# Patient Record
Sex: Female | Born: 2000 | Hispanic: Yes | Smoking: Never smoker
Health system: Southern US, Community
[De-identification: ages and names within clinical notes are randomized; demographics above are authoritative.]

## PROBLEM LIST (undated history)

## (undated) DIAGNOSIS — N390 Urinary tract infection, site not specified: Secondary | ICD-10-CM

## (undated) DIAGNOSIS — Z789 Other specified health status: Secondary | ICD-10-CM

## (undated) HISTORY — PX: NO PAST SURGERIES: SHX2092

## (undated) HISTORY — DX: Other specified health status: Z78.9

---
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
## 2020-01-09 ENCOUNTER — Encounter: Payer: Medicaid Other | Admitting: Obstetrics & Gynecology

## 2020-01-22 DIAGNOSIS — H5213 Myopia, bilateral: Secondary | ICD-10-CM | POA: Diagnosis not present

## 2020-02-28 DIAGNOSIS — H5213 Myopia, bilateral: Secondary | ICD-10-CM | POA: Diagnosis not present

## 2020-02-28 DIAGNOSIS — H52221 Regular astigmatism, right eye: Secondary | ICD-10-CM | POA: Diagnosis not present

## 2020-07-28 ENCOUNTER — Other Ambulatory Visit: Payer: Self-pay

## 2020-07-28 ENCOUNTER — Ambulatory Visit (INDEPENDENT_AMBULATORY_CARE_PROVIDER_SITE_OTHER): Payer: Medicaid Other | Admitting: Pediatrics

## 2020-07-28 ENCOUNTER — Encounter: Payer: Self-pay | Admitting: Pediatrics

## 2020-07-28 ENCOUNTER — Other Ambulatory Visit (HOSPITAL_COMMUNITY)
Admission: RE | Admit: 2020-07-28 | Discharge: 2020-07-28 | Disposition: A | Discharge status: Home / Self Care | Payer: Medicaid Other | Source: Ambulatory Visit | Attending: Pediatrics | Admitting: Pediatrics

## 2020-07-28 ENCOUNTER — Ambulatory Visit (INDEPENDENT_AMBULATORY_CARE_PROVIDER_SITE_OTHER): Payer: Medicaid Other | Admitting: Licensed Clinical Social Worker

## 2020-07-28 VITALS — BP 112/74 | Ht 63.62 in | Wt 156.4 lb

## 2020-07-28 DIAGNOSIS — F4323 Adjustment disorder with mixed anxiety and depressed mood: Secondary | ICD-10-CM | POA: Diagnosis not present

## 2020-07-28 DIAGNOSIS — R5383 Other fatigue: Secondary | ICD-10-CM

## 2020-07-28 DIAGNOSIS — Z113 Encounter for screening for infections with a predominantly sexual mode of transmission: Secondary | ICD-10-CM | POA: Insufficient documentation

## 2020-07-28 DIAGNOSIS — Z3202 Encounter for pregnancy test, result negative: Secondary | ICD-10-CM

## 2020-07-28 DIAGNOSIS — Z30013 Encounter for initial prescription of injectable contraceptive: Secondary | ICD-10-CM

## 2020-07-28 LAB — POCT URINE PREGNANCY: Preg Test, Ur: NEGATIVE

## 2020-07-28 LAB — TSH+FREE T4: TSH W/REFLEX TO FT4: 0.6 mIU/L

## 2020-07-28 MED ORDER — MEDROXYPROGESTERONE ACETATE 150 MG/ML IM SUSP
150.0000 mg | Freq: Once | INTRAMUSCULAR | Status: AC
  Administered 2020-07-28: 150 mg via INTRAMUSCULAR

## 2020-07-28 NOTE — Progress Notes (Signed)
  Subjective:    Amanda Sherman is a 20 y.o. old female here for birth control .    HPI Chief Complaint  Patient presents with   birth control   Stopped taking OCPs due to weight gain last winter.  About 1-2 months after stopping OCPs, she had an unwanted pregnancy  and a subsequent abortion.  She reports that she does not want to become pregnant again.  She is sexually active with 1 female partner - her boyfriend of 2 years.  They sometimes use condoms. She heard from a friend about Depo-provera and wanted to know if that would be a good option for her.    She also reports increased fatigue over the past few months since having the abortion.  She reports that she has not disclosed her abortion to anyone in her family and this has been stressful for her.  She is also concerned that she might have a thyroid problem since a cousin had a thyroid problem that caused fatigue.     Review of Systems  History and Problem List: Amanda Sherman has Wears glasses; School problem; Unintentional weight loss; and Acute viral pharyngitis on their problem list.  Amanda Sherman  has a past medical history of Medical history non-contributory.     Objective:    BP 112/74 (BP Location: Right Arm, Patient Position: Sitting, Cuff Size: Normal)   Ht 5' 3.62" (1.616 m)   Wt 156 lb 6 oz (70.9 kg)   BMI 27.16 kg/m  Physical Exam Constitutional:      Appearance: Normal appearance. She is not toxic-appearing.  Pulmonary:     Effort: Pulmonary effort is normal.  Neurological:     General: No focal deficit present.     Mental Status: She is alert and oriented to person, place, and time. Mental status is at baseline.  Psychiatric:        Behavior: Behavior normal.       Assessment and Plan:   Amanda Sherman is a 20 y.o. old female with  1. Other fatigue Warm hand off to integrated [facility] today to discuss recent stressors - patient in agreement.  Will obtain screening thyroid labs also.  - TSH + free T4  2. Routine screening for STI  (sexually transmitted infection) Due for annual screening, no symptoms. - Urine cytology ancillary only  3. Encounter for initial prescription of injectable contraceptive Discussed options for contraception.  Menses ended 2 days ago and no unprotected sex since last menses, so is appropriate for quickstart today.  Reviewed side effect of weight gain with depoprovera and [place] effects on bone mineral density.  Patient requests Depo-provera for contraception today, will consider Nexplanon for the fall. - medroxyPROGESTERone (DEPO-PROVERA) injection 150 mg  4. Negative pregnancy test - POCT urine pregnancy - Negative  Return for birth control (depo) follow-up in 3 months with Dr. Luna Fuse.  Time spent reviewing chart in preparation for visit:  4 minutes Time spent face-to-face with patient: 18 minutes Time spent not face-to-face with patient for documentation and care coordination on date of service: 10 minutes   Clifton Custard, MD

## 2020-07-30 ENCOUNTER — Institutional Professional Consult (permissible substitution): Payer: Medicaid Other | Admitting: Licensed Clinical Social Worker

## 2020-07-30 ENCOUNTER — Ambulatory Visit: Payer: Self-pay | Admitting: Pediatrics

## 2020-07-30 LAB — URINE CYTOLOGY ANCILLARY ONLY
Chlamydia: NEGATIVE
Comment: NEGATIVE
Comment: NORMAL
Neisseria Gonorrhea: NEGATIVE

## 2020-07-30 NOTE — BH Specialist Note (Signed)
Integrated Behavioral Health Initial In-Person Visit  MRN: [id number] Name: Amanda Sherman  Number of Integrated Behavioral Health Clinician visits:: 2/6 Session Start time: 5:00 PM Session End time: 5:27 PM Total time:  27  minutes  Types of Service: Individual psychotherapy  Interpretor:No. Interpretor Name and Language: n/a   Warm Hand Off Completed.     Subjective: Amanda Sherman is a 20 y.o. female accompanied by  self Patient was referred by Dr. Luna Fuse for concerns with mood. 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 friend and boyfriend, previously very active and would like to go back to the gym Life Changes: increased stress, left college  Patient and/or Family's Strengths/Protective Factors: Social connections, Social and Emotional competence, and Concrete supports in place (healthy food, safe environments, etc.)  Goals Addressed: Patient will: Reduce symptoms of: anxiety and depression Increase knowledge and/or ability of: coping skills  Demonstrate ability to: Increase healthy adjustment to current life circumstances  Progress towards Goals: Ongoing  Interventions: Interventions utilized: Solution-Focused Strategies, CBT Cognitive Behavioral Therapy, Supportive Counseling, Psychoeducation and/or Health Education, and Supportive Reflection  Standardized Assessments completed: Not Needed  Patient and/or Family Response: Patient reported increased stress related to recent life changes. Patient  interested in counseling services and "getting my life back on track". Patient reported increase in depressive symptoms impacting ability to engage in previous activities. Patient was receptive to education on CBT triangle (thoughts, feelings, behavior) and expressed motivation to change a behavior by returning to the gym this week.   Patient Centered Plan: Patient is on the following Treatment Plan(s): Adjustments   Assessment: Patient currently experiencing stress and depressive symptoms.   Patient may benefit from continued support of this clinic to increase knowledge and use of coping skills and possible future referral to ongoing counseling services.  Plan: Follow up with behavioral health clinician on : 7/14 Behavioral recommendations: Make a plan for an enjoyable activity between now and next session, reach out to social supports Referral(s): Integrated Behavioral Health Services (In Clinic) "From scale of 1-10, how likely are you to follow plan?": Patient agreeable to plan  Carleene Overlie, [facility]

## 2020-10-13 ENCOUNTER — Ambulatory Visit: Payer: Medicaid Other | Admitting: Pediatrics

## 2020-10-22 ENCOUNTER — Other Ambulatory Visit: Payer: Self-pay

## 2020-10-22 ENCOUNTER — Ambulatory Visit (INDEPENDENT_AMBULATORY_CARE_PROVIDER_SITE_OTHER): Payer: Medicaid Other | Admitting: Pediatrics

## 2020-10-22 ENCOUNTER — Encounter: Payer: Self-pay | Admitting: Pediatrics

## 2020-10-22 VITALS — BP 104/62 | HR 77 | Temp 97.0°F | Ht 63.8 in | Wt 154.2 lb

## 2020-10-22 DIAGNOSIS — L249 Irritant contact dermatitis, unspecified cause: Secondary | ICD-10-CM

## 2020-10-22 DIAGNOSIS — Z3042 Encounter for surveillance of injectable contraceptive: Secondary | ICD-10-CM | POA: Diagnosis not present

## 2020-10-22 MED ORDER — MEDROXYPROGESTERONE ACETATE 150 MG/ML IM SUSP
150.0000 mg | Freq: Once | INTRAMUSCULAR | Status: AC
  Administered 2020-10-22: 150 mg via INTRAMUSCULAR

## 2020-10-22 MED ORDER — TRIAMCINOLONE ACETONIDE 0.025 % EX OINT: 1.0000 "application " | TOPICAL_OINTMENT | Freq: Two times a day (BID) | CUTANEOUS | 2 refills | Status: DC

## 2020-10-22 NOTE — Patient Instructions (Signed)
Your next Depo-provera injection is due December 22nd-January 6th

## 2020-10-22 NOTE — Progress Notes (Signed)
  Subjective:    Amanda Sherman is a 20 y.o. old female here for rash and Follow-up (Birth control ) .    HPI Chief Complaint  Patient presents with   Follow-up    Birth control    First dose of depo was 3 months ago.  Had some spotting for the first month or two, no bleeding in the past month.  She is drinking milk and eating cheese for calcium and vitamin D.  No increased hunger.    She also reports that she has had an itchy rash on her chest that comes and goes over the past 2-3 months.  When she scratches it, it gets more bumpy and red.  No similar rash previously.  No new soaps, lotions, or detergents.  Review of Systems  History and Problem List: [name] has Wears glasses and School problem on their problem list.  Amanda Sherman  has a past medical history of Medical history non-contributory.     Objective:    BP 104/62 (BP Location: Right Arm, Patient Position: Sitting)   Pulse 77   Temp (!) 97 F (36.1 C) (Temporal)   Ht 5' 3.8" (1.621 m)   Wt 154 lb 3.2 oz (69.9 kg)   SpO2 97%   BMI 26.63 kg/m  Physical Exam Constitutional:      Appearance: Normal appearance. She is not toxic-appearing.  Skin:    Findings: Rash (mild erythematous rough dry patch on the left side of the chest just above the left breast) present.  Neurological:     Mental Status: She is alert.       Assessment and Plan:   Amanda Sherman is a 20 y.o. old female with  1. Irritant contact dermatitis, unspecified trigger Rash on the left side of the chest is consistent with an irritant/contact dermatitis - no clear trigger.  Recommend topical steroid ointment and switching to hypoallergenic skin care products and detergent.   - triamcinolone (KENALOG) 0.025 % ointment; Apply 1 application topically 2 (two) times daily. For itchy skin rash on chest  Dispense: 30 g; Refill: 2  2. Encounter for surveillance of injectable contraceptive Patient is within her 3 month window for her Depoprovera injection which was given today.   Also discussed transition to adult medicine and need to make an appointment with an adult provider - reviewed local options today.  Patient plans to call an adult provider to schedule an appointment soon.  If unable to get an appointment in time for her next Depo dose, then she can return for a nurse visit for next Depo 01/07/21-01/22/20. - medroxyPROGESTERone (DEPO-PROVERA) injection 150 mg    Return if symptoms worsen or fail to improve.  Clifton Custard, MD

## 2020-12-18 NOTE — Progress Notes (Signed)
Erroneous encounter

## 2020-12-21 ENCOUNTER — Encounter: Payer: Medicaid Other | Admitting: Family

## 2020-12-21 DIAGNOSIS — Z7689 Persons encountering health services in other specified circumstances: Secondary | ICD-10-CM

## 2021-04-07 ENCOUNTER — Ambulatory Visit: Payer: Medicaid Other | Admitting: Family

## 2021-06-09 ENCOUNTER — Encounter (HOSPITAL_COMMUNITY): Payer: Self-pay | Admitting: Obstetrics and Gynecology

## 2021-06-09 ENCOUNTER — Inpatient Hospital Stay (HOSPITAL_COMMUNITY): Payer: Medicaid Other

## 2021-06-09 ENCOUNTER — Inpatient Hospital Stay (HOSPITAL_COMMUNITY)
Admission: AD | Admit: 2021-06-09 | Discharge: 2021-06-09 | Disposition: A | Discharge status: Home / Self Care | Payer: Medicaid Other | Attending: Obstetrics and Gynecology | Admitting: Obstetrics and Gynecology

## 2021-06-09 DIAGNOSIS — O4690 Antepartum hemorrhage, unspecified, unspecified trimester: Secondary | ICD-10-CM

## 2021-06-09 DIAGNOSIS — O26891 Other specified pregnancy related conditions, first trimester: Secondary | ICD-10-CM | POA: Diagnosis not present

## 2021-06-09 DIAGNOSIS — B9689 Other specified bacterial agents as the cause of diseases classified elsewhere: Secondary | ICD-10-CM | POA: Diagnosis not present

## 2021-06-09 DIAGNOSIS — R109 Unspecified abdominal pain: Secondary | ICD-10-CM | POA: Diagnosis present

## 2021-06-09 DIAGNOSIS — O26899 Other specified pregnancy related conditions, unspecified trimester: Secondary | ICD-10-CM

## 2021-06-09 DIAGNOSIS — Z3A01 Less than 8 weeks gestation of pregnancy: Secondary | ICD-10-CM | POA: Diagnosis not present

## 2021-06-09 DIAGNOSIS — Z6740 Type O blood, Rh positive: Secondary | ICD-10-CM

## 2021-06-09 DIAGNOSIS — N939 Abnormal uterine and vaginal bleeding, unspecified: Secondary | ICD-10-CM | POA: Diagnosis not present

## 2021-06-09 DIAGNOSIS — O209 Hemorrhage in early pregnancy, unspecified: Secondary | ICD-10-CM | POA: Diagnosis not present

## 2021-06-09 DIAGNOSIS — O23591 Infection of other part of genital tract in pregnancy, first trimester: Secondary | ICD-10-CM | POA: Diagnosis not present

## 2021-06-09 HISTORY — DX: Urinary tract infection, site not specified: N39.0

## 2021-06-09 LAB — URINALYSIS, ROUTINE W REFLEX MICROSCOPIC
Bilirubin Urine: NEGATIVE
Glucose, UA: 50 mg/dL — AB
Ketones, ur: NEGATIVE mg/dL
Nitrite: NEGATIVE
Protein, ur: NEGATIVE mg/dL
Specific Gravity, Urine: 1.023 (ref 1.005–1.030)
pH: 5 (ref 5.0–8.0)

## 2021-06-09 LAB — CBC WITH DIFFERENTIAL/PLATELET
Abs Immature Granulocytes: 0 10*3/uL (ref 0.00–0.07)
Basophils Absolute: 0 10*3/uL (ref 0.0–0.1)
Basophils Relative: 0 %
Eosinophils Absolute: 0 10*3/uL (ref 0.0–0.5)
Eosinophils Relative: 0 %
HCT: 38.7 % (ref 36.0–46.0)
Hemoglobin: 13.6 g/dL (ref 12.0–15.0)
Lymphocytes Relative: 45 %
Lymphs Abs: 3.6 10*3/uL (ref 0.7–4.0)
MCH: 31.6 pg (ref 26.0–34.0)
MCHC: 35.1 g/dL (ref 30.0–36.0)
MCV: 90 fL (ref 80.0–100.0)
Monocytes Absolute: 0.2 10*3/uL (ref 0.1–1.0)
Monocytes Relative: 2 %
Neutro Abs: 4.2 10*3/uL (ref 1.7–7.7)
Neutrophils Relative %: 53 %
Platelets: 167 10*3/uL (ref 150–400)
RBC: 4.3 MIL/uL (ref 3.87–5.11)
RDW: 12.7 % (ref 11.5–15.5)
WBC: 7.9 10*3/uL (ref 4.0–10.5)
nRBC: 0 % (ref 0.0–0.2)
nRBC: 0 /100 WBC

## 2021-06-09 LAB — COMPREHENSIVE METABOLIC PANEL
ALT: 104 U/L — ABNORMAL HIGH (ref 0–44)
AST: 68 U/L — ABNORMAL HIGH (ref 15–41)
Albumin: 3.5 g/dL (ref 3.5–5.0)
Alkaline Phosphatase: 73 U/L (ref 38–126)
Anion gap: 6 (ref 5–15)
BUN: 8 mg/dL (ref 6–20)
CO2: 24 mmol/L (ref 22–32)
Calcium: 9.6 mg/dL (ref 8.9–10.3)
Chloride: 103 mmol/L (ref 98–111)
Creatinine, Ser: 0.64 mg/dL (ref 0.44–1.00)
GFR, Estimated: >60 mL/min (ref >60)
Glucose, Bld: 129 mg/dL — ABNORMAL HIGH (ref 70–99)
Potassium: 3.7 mmol/L (ref 3.5–5.1)
Sodium: 133 mmol/L — ABNORMAL LOW (ref 135–145)
Total Bilirubin: 0.6 mg/dL (ref 0.3–1.2)
Total Protein: 6.5 g/dL (ref 6.5–8.1)

## 2021-06-09 LAB — ABO/RH: ABO/RH(D): O POS

## 2021-06-09 LAB — WET PREP, GENITAL
Sperm: NONE SEEN
Trich, Wet Prep: NONE SEEN
WBC, Wet Prep HPF POC: >=10 — AB (ref <10)
Yeast Wet Prep HPF POC: NONE SEEN

## 2021-06-09 LAB — POCT PREGNANCY, URINE: Preg Test, Ur: POSITIVE — AB

## 2021-06-09 LAB — HCG, QUANTITATIVE, PREGNANCY: hCG, Beta Chain, Quant, S: 46261 m[IU]/mL — ABNORMAL HIGH (ref <5)

## 2021-06-09 NOTE — MAU Note (Signed)
Amanda Sherman is a 21 y.o. at Unknown here in MAU reporting: about a wk ago, was feeling nauseous, took a HPT +.x2.  started cramping aobut 2 wks ago.  Cycles have been irregular, thought she was going to get her period.  Last night she was cramping really bad and noted some bleeding this morning, "was heavy- had to wipe 3 times". Put on a pad, just changed it, was not soaked. Lt red in color LMP: 3/12 Onset of complaint: last night Pain score: moderate, like regular cramping for a period Vitals:   06/09/21 1305  BP: 116/62  Pulse: 86  Resp: 17  Temp: 98.2 F (36.8 C)  SpO2: 98%      Lab orders placed from triage:  UA/UPT

## 2021-06-09 NOTE — MAU Provider Note (Signed)
History     CSN: [id number]  Arrival date and time: 06/09/21 1212   Event Date/Time   First Provider Initiated Contact with Patient 06/09/21 1318      Chief Complaint  Patient presents with   Vaginal Bleeding   Abdominal Pain   Possible Pregnancy   HPI   [name] is a 21 y.o. female G2P0010 @ 4624w1d here in MAU with complaints of cramping and vaginal bleeding. She reports cramping for 1.5 weeks. The cramping comes and goes. She took tylenol which did not help.She has abnormal menstrual cycles and is uncertain of LMP.  She reports very light red vaginal discharge. This spotting started this morning.   OB History     Gravida  2   Para      Term      Preterm      AB  1   Living         SAB      IAB  1   Ectopic      Multiple      Live Births           Obstetric Comments  2021 medication AB         Past Medical History:  Diagnosis Date   Medical history non-contributory    UTI (urinary tract infection)     Past Surgical History:  Procedure Laterality Date   NO PAST SURGERIES      Family History  Problem Relation Age of Onset   Healthy Mother    Drug abuse Father        overdose   Stroke Father 1129       died at age 21    Hypertension Father    Heart disease Other 3850       died at age 21 from a heart attack    Social History   Tobacco Use   Smoking status: Never   Smokeless tobacco: Never   Tobacco comments:    Stopped vaping with +prg test 5/23  Vaping Use   Vaping Use: Former  Substance Use Topics   Alcohol use: Not Currently    Comment: only on the wkends, usually every other   Drug use: Never    Allergies: No Known Allergies  Medications Prior to Admission  Medication Sig Dispense Refill Last Dose   Pseudoeph-Doxylamine-DM-APAP (NYQUIL PO) Take by mouth.   06/07/2021   triamcinolone (KENALOG) 0.025 % ointment Apply 1 application topically 2 (two) times daily. For itchy skin rash on chest 30 g 2    Results  for orders placed or performed during the hospital encounter of 06/09/21 (from the past 48 hour(s))  Urinalysis, Routine w reflex microscopic Urine, Clean Catch     Status: Abnormal   Collection Time: 06/09/21 12:25 PM  Result Value Ref Range   Color, Urine YELLOW YELLOW   APPearance CLOUDY (A) CLEAR   Specific Gravity, Urine 1.023 1.005 - 1.030   pH 5.0 5.0 - 8.0   Glucose, UA 50 (A) NEGATIVE mg/dL   Hgb urine dipstick LARGE (A) NEGATIVE   Bilirubin Urine NEGATIVE NEGATIVE   Ketones, ur NEGATIVE NEGATIVE mg/dL   Protein, ur NEGATIVE NEGATIVE mg/dL   Nitrite NEGATIVE NEGATIVE   Leukocytes,Ua MODERATE (A) NEGATIVE   RBC / HPF 6-10 0 - 5 RBC/hpf   WBC, UA 11-20 0 - 5 WBC/hpf   Bacteria, UA MANY (A) NONE SEEN   Squamous Epithelial / LPF 21-50 0 - 5   Mucus  PRESENT     Comment: Performed at [facility], [address], [place], [place] [address]  Pregnancy, urine POC     Status: Abnormal   Collection Time: 06/09/21 12:35 PM  Result Value Ref Range   Preg Test, Ur POSITIVE (A) NEGATIVE    Comment:        THE SENSITIVITY OF THIS METHODOLOGY IS >24 mIU/mL   CBC with Differential/Platelet     Status: None   Collection Time: 06/09/21  1:23 PM  Result Value Ref Range   WBC 7.9 4.0 - 10.5 K/uL   RBC 4.30 3.87 - 5.11 MIL/uL   Hemoglobin 13.6 12.0 - 15.0 g/dL   HCT 70.2 63.7 - 85.8 %   MCV 90.0 80.0 - 100.0 fL   MCH 31.6 26.0 - 34.0 pg   MCHC 35.1 30.0 - 36.0 g/dL   RDW 85.0 27.7 - 41.2 %   Platelets 167 150 - 400 K/uL   nRBC 0.0 0.0 - 0.2 %   Neutrophils Relative % 53 %   Neutro Abs 4.2 1.7 - 7.7 K/uL   Lymphocytes Relative 45 %   Lymphs Abs 3.6 0.7 - 4.0 K/uL   Monocytes Relative 2 %   Monocytes Absolute 0.2 0.1 - 1.0 K/uL   Eosinophils Relative 0 %   Eosinophils Absolute 0.0 0.0 - 0.5 K/uL   Basophils Relative 0 %   Basophils Absolute 0.0 0.0 - 0.1 K/uL   WBC Morphology WHITE COUNT CONFIRMED ON SMEAR    RBC Morphology MORPHOLOGY UNREMARKABLE    Smear Review PLATELET  COUNT CONFIRMED BY SMEAR    nRBC 0 0 /100 WBC   Abs Immature Granulocytes 0.00 0.00 - 0.07 K/uL    Comment: Performed at [facility], [address], [place], [place] [address]  Comprehensive metabolic panel     Status: Abnormal   Collection Time: 06/09/21  1:23 PM  Result Value Ref Range   Sodium 133 (L) 135 - 145 mmol/L   Potassium 3.7 3.5 - 5.1 mmol/L   Chloride 103 98 - 111 mmol/L   CO2 24 22 - 32 mmol/L   Glucose, Bld 129 (H) 70 - 99 mg/dL    Comment: Glucose reference range applies only to samples taken after fasting for at least 8 hours.   BUN 8 6 - 20 mg/dL   Creatinine, Ser 6.72 0.44 - 1.00 mg/dL   Calcium 9.6 8.9 - 09.4 mg/dL   Total Protein 6.5 6.5 - 8.1 g/dL   Albumin 3.5 3.5 - 5.0 g/dL   AST 68 (H) 15 - 41 U/L   ALT 104 (H) 0 - 44 U/L   Alkaline Phosphatase 73 38 - 126 U/L   Total Bilirubin 0.6 0.3 - 1.2 mg/dL   GFR, Estimated >70 >96 mL/min    Comment: (NOTE) Calculated using the CKD-EPI Creatinine Equation (2021)    Anion gap 6 5 - 15    Comment: Performed at [facility], [address], [place], [place] [address]  ABO/Rh     Status: None   Collection Time: 06/09/21  1:23 PM  Result Value Ref Range   ABO/RH(D) O POS    No rh immune globuloin      NOT A RH IMMUNE GLOBULIN CANDIDATE, PT RH POSITIVE Performed at [facility], [address] [place], [place] [address]   hCG, quantitative, pregnancy     Status: Abnormal   Collection Time: 06/09/21  1:23 PM  Result Value  Ref Range   hCG, Beta Chain, Quant, S 46,261 (H) <5 mIU/mL    Comment:          GEST. AGE      CONC.  (mIU/mL)   <=1 WEEK        5 - 50     2 WEEKS       50 - 500     3 WEEKS       100 - 10,000     4 WEEKS     1,000 - 30,000     5 WEEKS     3,500 - 115,000   6-8 WEEKS     12,000 - 270,000    12 WEEKS     15,000 - 220,000        FEMALE AND NON-PREGNANT FEMALE:     LESS THAN 5 mIU/mL Performed at [facility], [address] [place], [place] [address]   Wet prep,  genital     Status: Abnormal   Collection Time: 06/09/21  1:23 PM   Specimen: PATH Cytology Cervicovaginal Ancillary Only  Result Value Ref Range   Yeast Wet Prep HPF POC NONE SEEN NONE SEEN   Trich, Wet Prep NONE SEEN NONE SEEN   Clue Cells Wet Prep HPF POC PRESENT (A) NONE SEEN   WBC, Wet Prep HPF POC >=10 (A) <10   Sperm NONE SEEN     Comment: Performed at [facility], [address] [place], [place] [address]     US OB LESS THAN 14 WEEKS WITH OB TRANSVAGINAL  Result Date: 06/09/2021 CLINICAL DATA:  Vaginal bleeding, cramping; LMP 03/28/2021 EXAM: OBSTETRIC <14 WK [place] AND TRANSVAGINAL OB US TECHNIQUE: Both transabdominal and transvaginal ultrasound examinations were performed for complete evaluation of the gestation as well as the maternal uterus, adnexal regions, and pelvic cul-de-sac. Transvaginal technique was performed to assess early pregnancy. COMPARISON:  None Available. FINDINGS: Intrauterine gestational sac: Single Yolk sac:  Visualized. Embryo:  Visualized. Cardiac Activity: Visualized. Heart Rate: 109 bpm CRL: 4.5 mm   6 w   1 d                  [place] EDC: 02/01/2022 Subchorionic hemorrhage:  None visualized. Maternal uterus/adnexae: Unremarkable.  Trace free fluid. IMPRESSION: Single intrauterine pregnancy with cardiac activity present. However, dating by crown-rump length is incongruent with gestational age by reported LMP. Heart rate is 109. Electronically Signed   By: Guadlupe Spanish M.D.   On: 06/09/2021 14:23     Review of Systems  Constitutional:  Negative for fever.  Gastrointestinal:  Positive for abdominal pain. Negative for nausea and vomiting.  Genitourinary:  Positive for vaginal bleeding.  Physical Exam   Blood pressure 116/62, pulse 86, temperature 98.2 F (36.8 C), temperature source Oral, resp. rate 17, height 5\' 3"  (1.6 m), weight 74.7 kg, last menstrual period 03/28/2021, SpO2 98 %.  Physical Exam Constitutional:      General: She is not in acute  distress.    Appearance: She is well-developed. She is not ill-appearing, toxic-appearing or diaphoretic.  Abdominal:     Tenderness: There is no abdominal tenderness.     Hernia: No hernia is present.  Genitourinary:    Comments: Wet prep and GC collected by patient.  Skin:    General: Skin is warm.  Neurological:     Mental Status: She is alert.   MAU Course  Procedures None  MDM  O positive blood type  Wet prep & GC HIV, CBC, Hcg, ABO US OB transvaginal    Assessment and Plan   A:  1. Type O blood, Rh positive   2. Vaginal bleeding in pregnancy   3. Abdominal pain affecting pregnancy   4. Bacterial vaginosis      P:  DC home Rx: Flagyl Return to MAU if symptoms worsen Start prenatal vitamins Patient has prenatal care established.  Duane Lope, NP 06/09/2021 3:21 PM

## 2021-06-10 ENCOUNTER — Other Ambulatory Visit: Payer: Self-pay | Admitting: *Deleted

## 2021-06-10 LAB — GC/CHLAMYDIA PROBE AMP (~~LOC~~) NOT AT ARMC
Chlamydia: NEGATIVE
Comment: NEGATIVE
Comment: NORMAL
Neisseria Gonorrhea: NEGATIVE

## 2021-06-10 LAB — CULTURE, OB URINE: Culture: NO GROWTH

## 2021-06-10 MED ORDER — METRONIDAZOLE 500 MG PO TABS: 500.0000 mg | ORAL_TABLET | Freq: Two times a day (BID) | ORAL | 0 refills | Status: AC

## 2021-07-08 ENCOUNTER — Ambulatory Visit (INDEPENDENT_AMBULATORY_CARE_PROVIDER_SITE_OTHER): Payer: Medicaid Other | Admitting: Obstetrics and Gynecology

## 2021-07-08 ENCOUNTER — Other Ambulatory Visit (HOSPITAL_COMMUNITY)
Admission: RE | Admit: 2021-07-08 | Discharge: 2021-07-08 | Disposition: A | Discharge status: Home / Self Care | Payer: Medicaid Other | Source: Ambulatory Visit | Attending: Obstetrics and Gynecology | Admitting: Obstetrics and Gynecology

## 2021-07-08 ENCOUNTER — Encounter: Payer: Self-pay | Admitting: Obstetrics and Gynecology

## 2021-07-08 VITALS — BP 124/78 | HR 93 | Wt 162.0 lb

## 2021-07-08 DIAGNOSIS — Z3A10 10 weeks gestation of pregnancy: Secondary | ICD-10-CM

## 2021-07-08 DIAGNOSIS — Z3401 Encounter for supervision of normal first pregnancy, first trimester: Secondary | ICD-10-CM | POA: Diagnosis not present

## 2021-07-08 DIAGNOSIS — Z3490 Encounter for supervision of normal pregnancy, unspecified, unspecified trimester: Secondary | ICD-10-CM | POA: Insufficient documentation

## 2021-07-08 DIAGNOSIS — N939 Abnormal uterine and vaginal bleeding, unspecified: Secondary | ICD-10-CM | POA: Insufficient documentation

## 2021-07-08 DIAGNOSIS — Z3481 Encounter for supervision of other normal pregnancy, first trimester: Secondary | ICD-10-CM | POA: Diagnosis not present

## 2021-07-08 NOTE — Progress Notes (Signed)
NOB 10w  Pap: Never  Genetic Screening: Desires.  CC: Spotting with mild cramping.

## 2021-07-09 LAB — CBC/D/PLT+RPR+RH+ABO+RUBIGG...
Antibody Screen: NEGATIVE
Basophils Absolute: 0.1 10*3/uL (ref 0.0–0.2)
Basos: 1 %
EOS (ABSOLUTE): 0.1 10*3/uL (ref 0.0–0.4)
Eos: 1 %
HCV Ab: NONREACTIVE
HIV Screen 4th Generation wRfx: NONREACTIVE
Hematocrit: 38.6 % (ref 34.0–46.6)
Hemoglobin: 13.5 g/dL (ref 11.1–15.9)
Hepatitis B Surface Ag: NEGATIVE
Immature Grans (Abs): 0 10*3/uL (ref 0.0–0.1)
Immature Granulocytes: 0 %
Lymphocytes Absolute: 2.2 10*3/uL (ref 0.7–3.1)
Lymphs: 28 %
MCH: 32.2 pg (ref 26.6–33.0)
MCHC: 35 g/dL (ref 31.5–35.7)
MCV: 92 fL (ref 79–97)
Monocytes Absolute: 0.4 10*3/uL (ref 0.1–0.9)
Monocytes: 5 %
Neutrophils Absolute: 5.1 10*3/uL (ref 1.4–7.0)
Neutrophils: 65 %
Platelets: 189 10*3/uL (ref 150–450)
RBC: 4.19 x10E6/uL (ref 3.77–5.28)
RDW: 13.1 % (ref 11.7–15.4)
RPR Ser Ql: NONREACTIVE
Rh Factor: POSITIVE
Rubella Antibodies, IGG: 1.5 index (ref >0.99)
WBC: 7.9 10*3/uL (ref 3.4–10.8)

## 2021-07-09 LAB — HCV INTERPRETATION

## 2021-07-09 LAB — CYTOLOGY - PAP
Chlamydia: NEGATIVE
Comment: NEGATIVE
Comment: NEGATIVE
Comment: NORMAL
Diagnosis: NEGATIVE
Neisseria Gonorrhea: NEGATIVE
Trichomonas: NEGATIVE

## 2021-07-15 LAB — PANORAMA PRENATAL TEST FULL PANEL:PANORAMA TEST PLUS 5 ADDITIONAL MICRODELETIONS: FETAL FRACTION: 7.3

## 2021-07-15 LAB — HORIZON 4 (SMA, CF, FRAGILE X, DMD)
CYSTIC FIBROSIS: NEGATIVE
DUCHENNE/BECKER MUSCULAR DYSTROPHY: NEGATIVE
FRAGILE X SYNDROME: NEGATIVE
REPORT SUMMARY: NEGATIVE
SPINAL MUSCULAR ATROPHY: NEGATIVE

## 2021-08-05 ENCOUNTER — Encounter: Payer: Self-pay | Admitting: Obstetrics and Gynecology

## 2021-08-05 ENCOUNTER — Ambulatory Visit (INDEPENDENT_AMBULATORY_CARE_PROVIDER_SITE_OTHER): Payer: Medicaid Other | Admitting: Obstetrics and Gynecology

## 2021-08-05 VITALS — BP 107/73 | HR 109 | Wt 162.0 lb

## 2021-08-05 DIAGNOSIS — Z3482 Encounter for supervision of other normal pregnancy, second trimester: Secondary | ICD-10-CM

## 2021-08-05 DIAGNOSIS — Z3A14 14 weeks gestation of pregnancy: Secondary | ICD-10-CM | POA: Diagnosis not present

## 2021-08-05 DIAGNOSIS — Z3490 Encounter for supervision of normal pregnancy, unspecified, unspecified trimester: Secondary | ICD-10-CM

## 2021-08-05 NOTE — Progress Notes (Signed)
   PRENATAL VISIT NOTE  Subjective:  Amanda Sherman is a 21 y.o. G2P0010 at [redacted]w[redacted]d being seen today for ongoing prenatal care.  She is currently monitored for the following issues for this low-risk pregnancy and has Supervision of normal pregnancy on their problem list.  Patient reports no complaints.   . Vag. Bleeding: None.  Movement: Absent. Denies leaking of fluid.   The following portions of the patient's history were reviewed and updated as appropriate: allergies, current medications, past family history, past medical history, past social history, past surgical history and problem list.   Objective:   Vitals:   08/05/21 1348  BP: 107/73  Pulse: (!) 109  Weight: 162 lb (73.5 kg)    Fetal Status: Fetal Heart Rate (bpm): 156   Movement: Absent     General:  Alert, oriented and cooperative. Patient is in no acute distress.  Skin: Skin is warm and dry. No rash noted.   Cardiovascular: Normal heart rate noted  Respiratory: Normal respiratory effort, no problems with respiration noted  Abdomen: Soft, gravid, appropriate for gestational age.  Pain/Pressure: Absent     Pelvic: Cervical exam deferred        Extremities: Normal range of motion.  Edema: None  Mental Status: Normal mood and affect. Normal behavior. Normal judgment and thought content.   Assessment and Plan:  Pregnancy: G2P0010 at [redacted]w[redacted]d 1. Encounter for supervision of normal pregnancy, antepartum, unspecified gravidity Offer afp nv - [place] MFM OB COMP + 14 WK; Future  2. [redacted] weeks gestation of pregnancy  Preterm labor symptoms and general obstetric precautions including but not limited to vaginal bleeding, contractions, leaking of fluid and fetal movement were reviewed in detail with the patient. Please refer to After Visit Summary for other counseling recommendations.   Return in about 1 month (around 09/05/2021) for in person, md or app, low risk ob.  Future Appointments  Date Time Provider Department Center   09/02/2021 10:35 AM Talent Bing, MD [facility]  09/30/2021  3:30 PM [place] Bing, MD [facility]  10/28/2021  1:30 PM Anyanwu, Jethro Bastos, MD [facility]    [place] Bing, MD

## 2021-08-05 NOTE — Progress Notes (Signed)
ROB [redacted]w[redacted]d  Pt does not want to know gender.   CC: None

## 2021-09-02 ENCOUNTER — Encounter: Payer: Medicaid Other | Admitting: Obstetrics and Gynecology

## 2021-09-14 ENCOUNTER — Ambulatory Visit: Payer: Medicaid Other

## 2021-09-30 ENCOUNTER — Encounter: Payer: Medicaid Other | Admitting: Obstetrics and Gynecology

## 2021-09-30 ENCOUNTER — Encounter: Payer: Self-pay | Admitting: Obstetrics and Gynecology

## 2021-10-02 NOTE — Progress Notes (Signed)
Patient did not keep her OB appointment for 09/30/2021.  Durene Romans MD Attending Center for [employer] farm manager)

## 2021-10-28 ENCOUNTER — Encounter: Payer: Medicaid Other | Admitting: Obstetrics & Gynecology

## 2021-12-15 ENCOUNTER — Telehealth: Payer: Self-pay

## 2021-12-15 DIAGNOSIS — Z09 Encounter for follow-up examination after completed treatment for conditions other than malignant neoplasm: Secondary | ICD-10-CM

## 2021-12-15 NOTE — Telephone Encounter (Signed)
[facility] mailed pt Adolescent Transition dismissal letter. Pt can call [facility] if needing assistance finding and scheduling with an adult PCP.     Risa Grill, BSW, QP Social Work Case Interior and spatial designer and [employer] [facility] Office: [phone] Direct Number: [phone]

## 2021-12-31 DIAGNOSIS — Z3202 Encounter for pregnancy test, result negative: Secondary | ICD-10-CM | POA: Diagnosis not present

## 2021-12-31 DIAGNOSIS — Z23 Encounter for immunization: Secondary | ICD-10-CM | POA: Diagnosis not present

## 2021-12-31 DIAGNOSIS — Z789 Other specified health status: Secondary | ICD-10-CM | POA: Diagnosis not present

## 2021-12-31 DIAGNOSIS — Z6828 Body mass index (BMI) 28.0-28.9, adult: Secondary | ICD-10-CM | POA: Diagnosis not present

## 2021-12-31 DIAGNOSIS — Z3042 Encounter for surveillance of injectable contraceptive: Secondary | ICD-10-CM | POA: Diagnosis not present

## 2022-03-28 DIAGNOSIS — H5213 Myopia, bilateral: Secondary | ICD-10-CM | POA: Diagnosis not present

## 2022-03-29 DIAGNOSIS — Z3042 Encounter for surveillance of injectable contraceptive: Secondary | ICD-10-CM | POA: Diagnosis not present

## 2022-04-10 DIAGNOSIS — R0981 Nasal congestion: Secondary | ICD-10-CM | POA: Diagnosis not present

## 2022-04-10 DIAGNOSIS — J029 Acute pharyngitis, unspecified: Secondary | ICD-10-CM | POA: Diagnosis not present

## 2022-04-10 DIAGNOSIS — H6642 Suppurative otitis media, unspecified, left ear: Secondary | ICD-10-CM | POA: Diagnosis not present

## 2022-04-10 DIAGNOSIS — J040 Acute laryngitis: Secondary | ICD-10-CM | POA: Diagnosis not present

## 2022-04-10 DIAGNOSIS — R0982 Postnasal drip: Secondary | ICD-10-CM | POA: Diagnosis not present

## 2022-11-14 ENCOUNTER — Emergency Department (HOSPITAL_COMMUNITY)
Admission: EM | Admit: 2022-11-14 | Discharge: 2022-11-15 | Disposition: A | Discharge status: Home / Self Care | Payer: Medicaid Other | Attending: Emergency Medicine | Admitting: Emergency Medicine

## 2022-11-14 ENCOUNTER — Other Ambulatory Visit: Payer: Self-pay

## 2022-11-14 DIAGNOSIS — H5711 Ocular pain, right eye: Secondary | ICD-10-CM | POA: Diagnosis present

## 2022-11-14 DIAGNOSIS — S0501XA Injury of conjunctiva and corneal abrasion without foreign body, right eye, initial encounter: Secondary | ICD-10-CM | POA: Diagnosis not present

## 2022-11-14 DIAGNOSIS — X58XXXA Exposure to other specified factors, initial encounter: Secondary | ICD-10-CM | POA: Diagnosis not present

## 2022-11-14 MED ORDER — KETOROLAC TROMETHAMINE 0.5 % OP SOLN
1.0000 [drp] | Freq: Four times a day (QID) | OPHTHALMIC | Status: DC
  Administered 2022-11-15: 1 [drp] via OPHTHALMIC
  Filled 2022-11-14: qty 5

## 2022-11-14 MED ORDER — FLUORESCEIN SODIUM 1 MG OP STRP
1.0000 | ORAL_STRIP | Freq: Once | OPHTHALMIC | Status: AC
  Administered 2022-11-14: 1 via OPHTHALMIC
  Filled 2022-11-14: qty 1

## 2022-11-14 MED ORDER — CIPROFLOXACIN HCL 0.3 % OP SOLN
1.0000 [drp] | OPHTHALMIC | Status: DC
  Administered 2022-11-15: 1 [drp] via OPHTHALMIC
  Filled 2022-11-14: qty 2.5

## 2022-11-14 MED ORDER — TETRACAINE HCL 0.5 % OP SOLN
1.0000 [drp] | Freq: Once | OPHTHALMIC | Status: AC
  Administered 2022-11-14: 1 [drp] via OPHTHALMIC
  Filled 2022-11-14: qty 4

## 2022-11-14 NOTE — ED Triage Notes (Addendum)
Pt states right eye started to hurt yesterday and she thought it was her contact. Removed contact and thought it would get better. Pt states feels like there is something in her eye. Pt states eye has been watering all day. Pt denies knowing if anything got in to eye. Pt tried over the counter drops and stated it got worse. Is able to see however vision in more blurry in right eye.

## 2022-11-14 NOTE — ED Notes (Signed)
Requested medication from pharmacy.

## 2022-11-14 NOTE — ED Provider Notes (Signed)
[place] EMERGENCY DEPARTMENT AT [facility] Provider Note   CSN: [id number] Arrival date & time: 11/14/22  1737     History {Add pertinent medical, surgical, social history, OB history to HPI:1} Chief Complaint  Patient presents with   Eye Pain    Amanda Sherman is a 22 y.o. female.  The history is provided by the patient and medical records.  Eye Pain   22 year old female presenting to the ED with right eye pain and irritation.  States she was in [place] last night and when she got home to remove her contacts she noticed her right contact lens was ripped.  Since that time she has had a lot of irritation in her right eye, almost feels like there is dirt or something in her eye.  She denies any trauma.  She has not tried to wear her contacts today.  Eye has been continuously tearing.  Does not currently have an eye doctor that she sees.  Home Medications Prior to Admission medications   Not on File      Allergies    Patient has no known allergies.    Review of Systems   Review of Systems  Eyes:  Positive for pain.  All other systems reviewed and are negative.   Physical Exam Updated Vital Signs BP 131/82 (BP Location: Left Arm)   Pulse 81   Temp 98.4 F (36.9 C) (Oral)   Resp 15   Ht 5\' 3"  (1.6 m)   Wt 81.6 kg   SpO2 99%   BMI 31.89 kg/m   Physical Exam Vitals and nursing note reviewed.  Constitutional:      Appearance: She is well-developed.  HENT:     Head: Normocephalic and atraumatic.  Eyes:     Conjunctiva/sclera: Conjunctivae normal.     Pupils: Pupils are equal, round, and reactive to light.     Comments: No lid edema or erythema, eye is tearing, no purulent drainage, no FB noted Fluorescein stain with small, crescent shaped corneal abrasion along mid visual field, no signs of ulcer formation, no uptake  Cardiovascular:     Rate and Rhythm: Normal rate and regular rhythm.     Heart sounds: Normal heart sounds.  Pulmonary:      Effort: Pulmonary effort is normal.     Breath sounds: Normal breath sounds.  Abdominal:     General: Bowel sounds are normal.     Palpations: Abdomen is soft.  Musculoskeletal:        General: Normal range of motion.     Cervical back: Normal range of motion.  Skin:    General: Skin is warm and dry.  Neurological:     Mental Status: She is alert and oriented to person, place, and time.     ED Results / Procedures / Treatments   Labs (all labs ordered are listed, but only abnormal results are displayed) Labs Reviewed - No data to display  EKG None  Radiology No results found.  Procedures Procedures  {Document cardiac monitor, telemetry assessment procedure when appropriate:1}  Medications Ordered in ED Medications  ciprofloxacin (CILOXAN) 0.3 % ophthalmic solution 1 drop (has no administration in time range)  ketorolac (ACULAR) 0.5 % ophthalmic solution 1 drop (has no administration in time range)  fluorescein ophthalmic strip 1 strip (1 strip Right Eye Given 11/14/22 2239)  tetracaine (PONTOCAINE) 0.5 % ophthalmic solution 1 drop (1 drop Right Eye Given 11/14/22 2239)    ED Course/ Medical Decision Making/ A&P   {  Click here for ABCD2, HEART and other calculatorsREFRESH Note before signing :1}                              Medical Decision Making Risk Prescription drug management.   ***  {Document critical care time when appropriate:1} {Document review of labs and clinical decision tools ie heart score, Chads2Vasc2 etc:1}  {Document your independent review of radiology images, and any outside records:1} {Document your discussion with family members, caretakers, and with consultants:1} {Document social determinants of health affecting pt's care:1} {Document your decision making why or why not admission, treatments were needed:1} Final Clinical Impression(s) / ED Diagnoses Final diagnoses:  None    Rx / DC Orders ED Discharge Orders     None

## 2022-11-15 NOTE — Discharge Instructions (Signed)
Continue using eye drops as directed. Follow-up with Dr. Allena Katz-- I would call his office in the morning to get this scheduled. Return here for new concerns.

## 2022-12-31 IMAGING — US US OB < 14 WEEKS - US OB TV
1 series · 15 of 28 positions shown · non-contrast
Comparison: None Available.

CLINICAL DATA: Vaginal bleeding, cramping; LMP 03/28/2021

EXAM:
OBSTETRIC <14 WK US AND TRANSVAGINAL OB US
TECHNIQUE: Both transabdominal and transvaginal ultrasound examinations were
performed for complete evaluation of the gestation as well as the
maternal uterus, adnexal regions, and pelvic cul-de-sac.
Transvaginal technique was performed to assess early pregnancy.

[Series 1: us ob < 14 weeks - us ob tv · 15 of 53 slices shown]
[im 1/53]
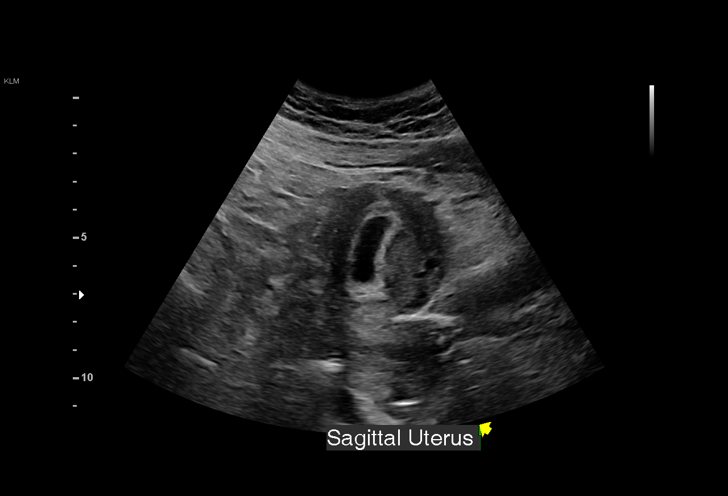
[im 4/53]
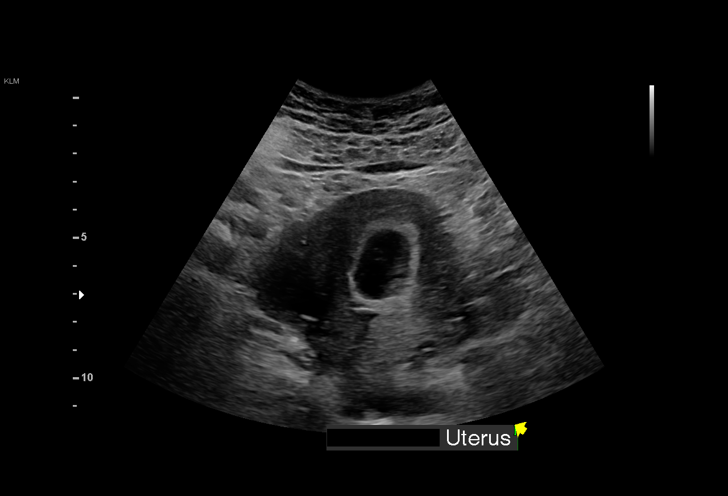
[im 8/53]
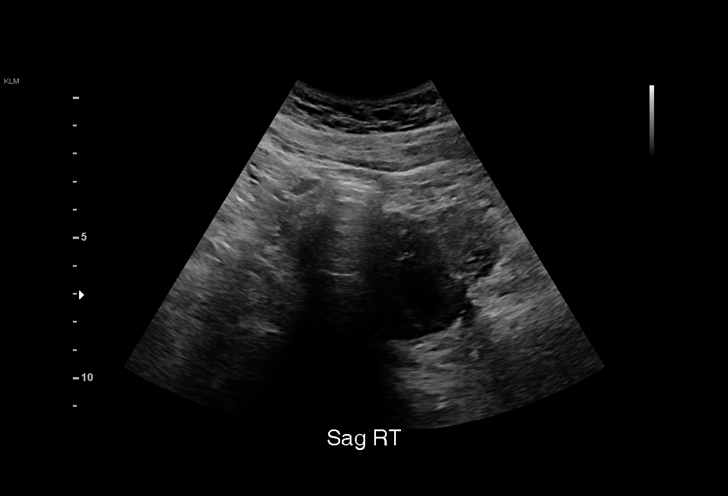
[im 12/53]
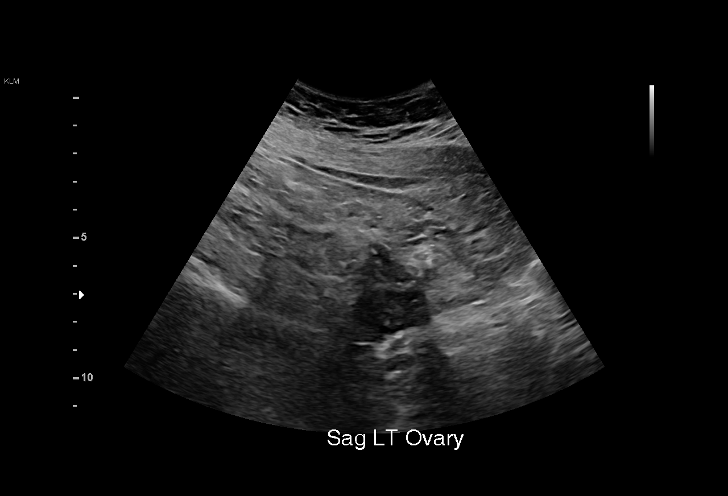
[im 16/53]
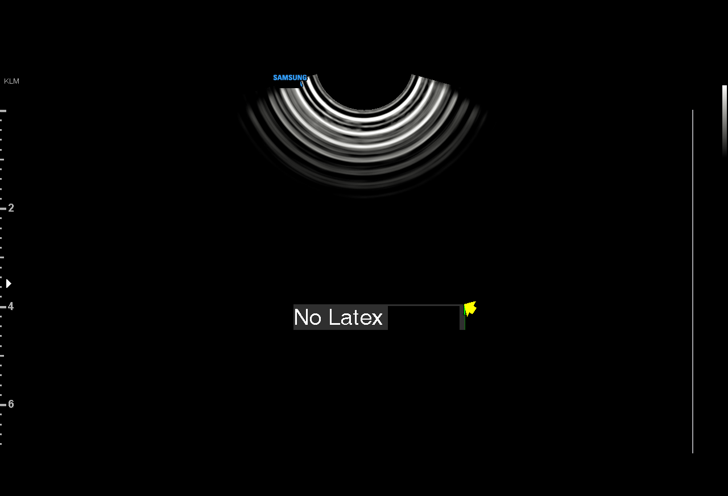
[im 20/53]
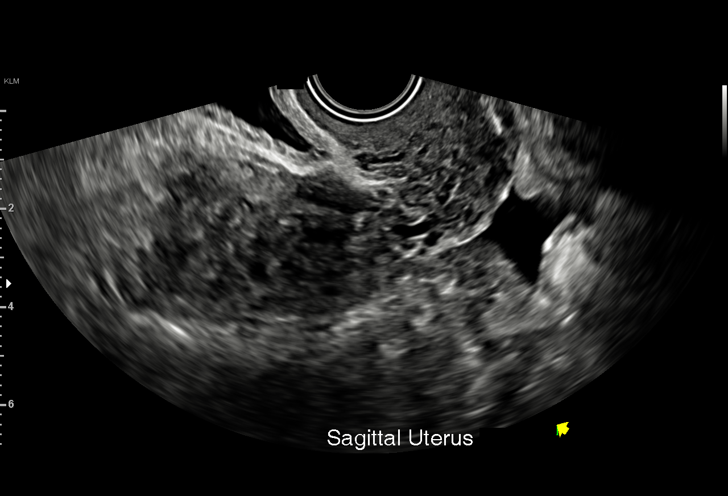
[im 24/53]
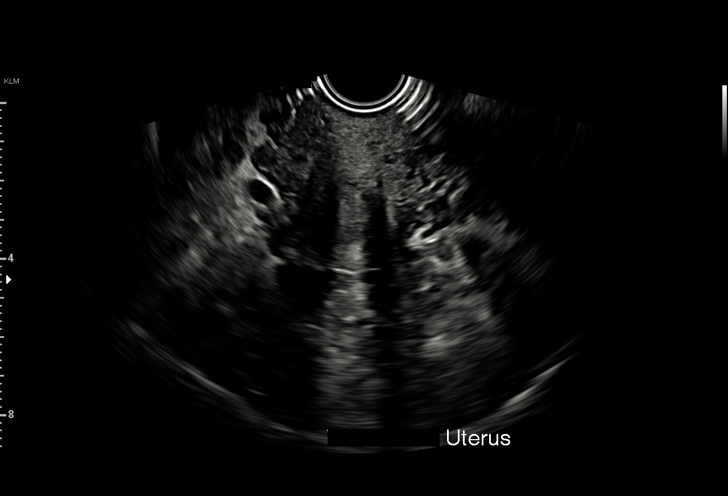
[im 27/53]
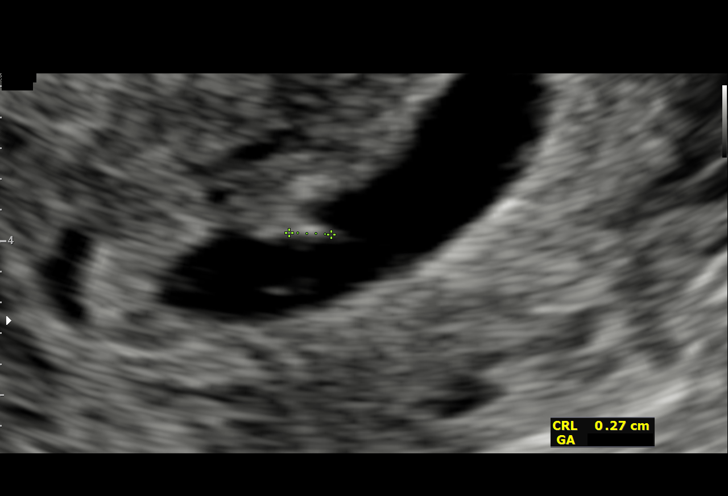
[im 29/53]
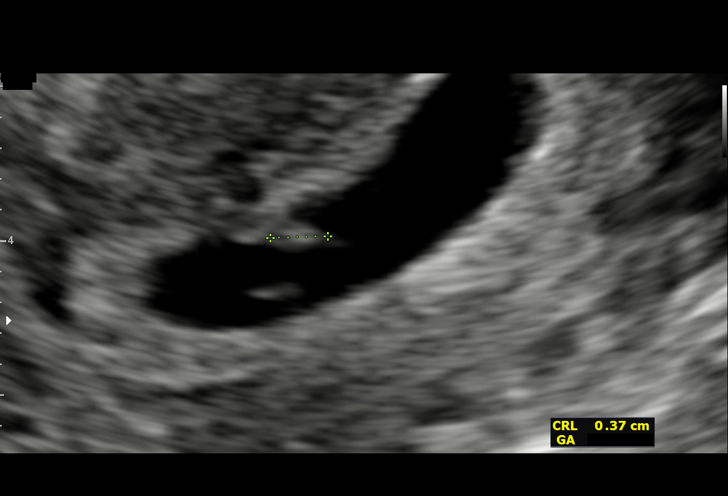
[im 33/53]
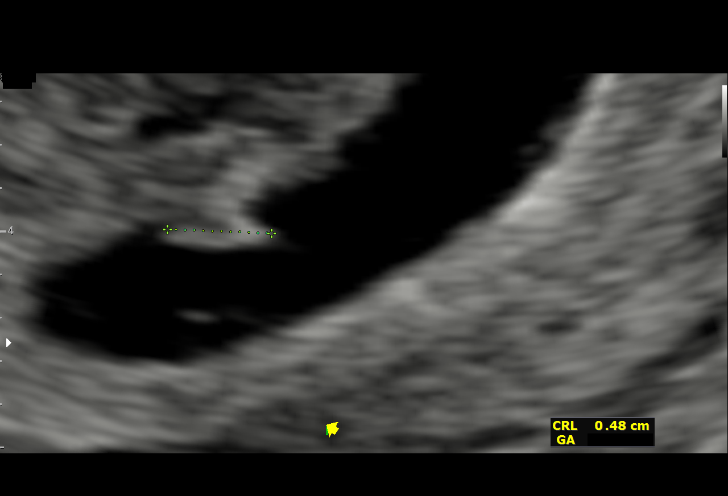
[im 37/53]
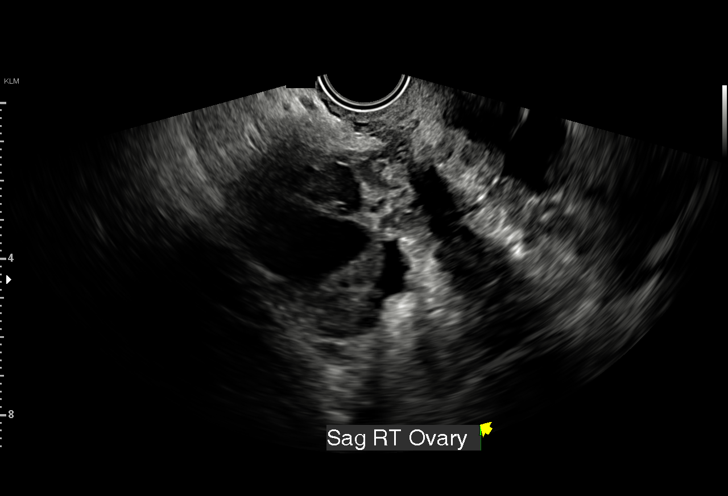
[im 41/53]
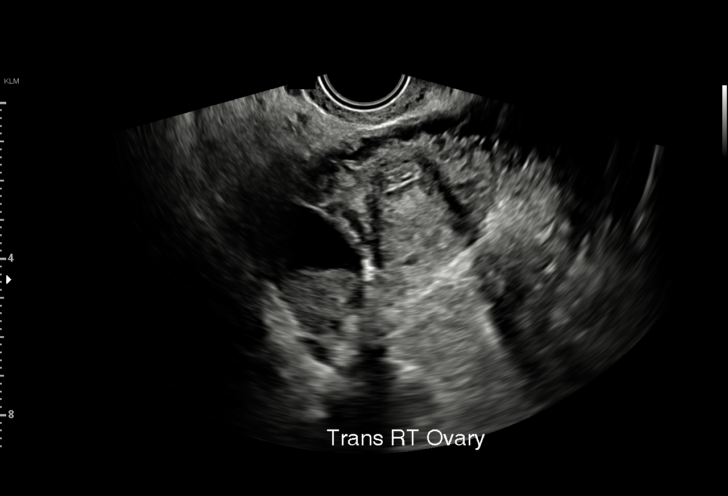
[im 45/53]
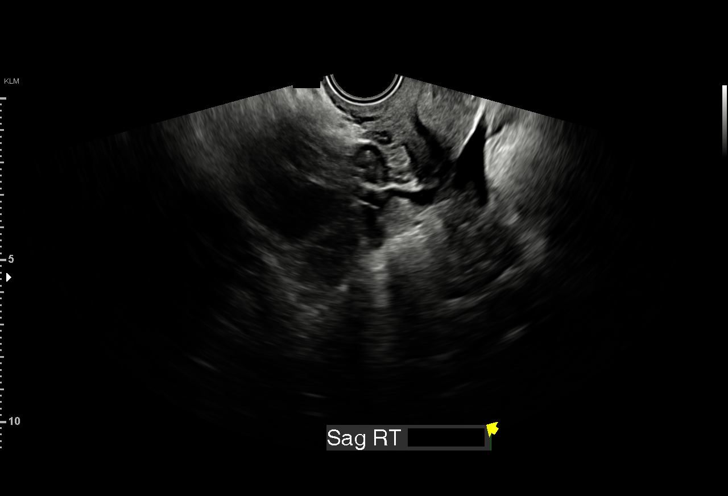
[im 49/53]
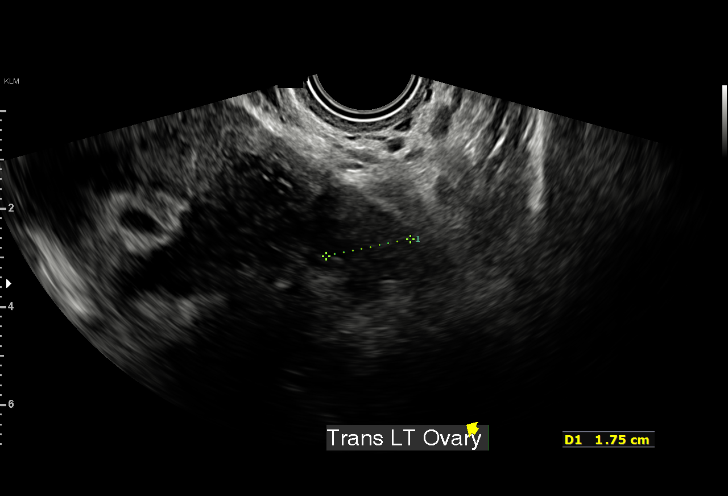
[im 53/53]
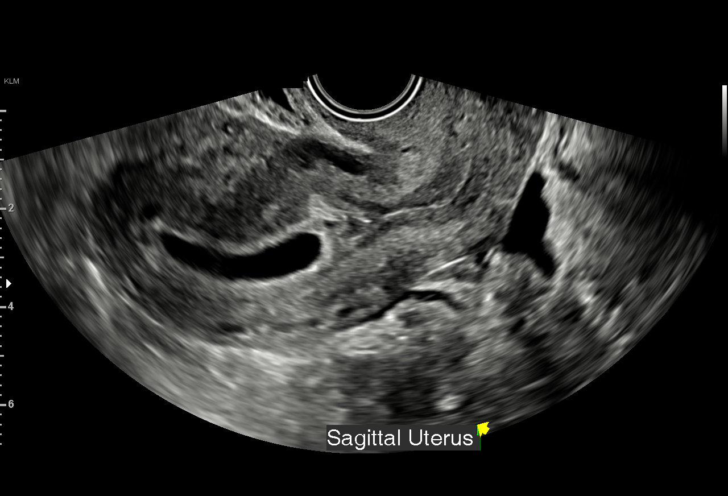

[15 of 28 positions shown; findings below may reference images not displayed]

FINDINGS: Intrauterine gestational sac: Single

Yolk sac:  Visualized.

Embryo:  Visualized.

Cardiac Activity: Visualized.

Heart Rate: 109 bpm

CRL: 4.5 mm   6 w   1 d                  US EDC: 02/01/2022

Subchorionic hemorrhage:  None visualized.

Maternal uterus/adnexae: Unremarkable.  Trace free fluid.
IMPRESSION: Single intrauterine pregnancy with cardiac activity present.
However, dating by crown-rump length is incongruent with gestational
age by reported LMP. Heart rate is 109.

## 2023-08-21 ENCOUNTER — Ambulatory Visit
Admission: RE | Admit: 2023-08-21 | Discharge: 2023-08-21 | Disposition: A | Discharge status: Home / Self Care | Payer: Self-pay | Attending: Family Medicine | Admitting: Family Medicine

## 2023-08-21 VITALS — BP 131/85 | HR 72 | Temp 99.2°F | Resp 19

## 2023-08-21 DIAGNOSIS — H5711 Ocular pain, right eye: Secondary | ICD-10-CM | POA: Diagnosis not present

## 2023-08-21 DIAGNOSIS — S0501XA Injury of conjunctiva and corneal abrasion without foreign body, right eye, initial encounter: Secondary | ICD-10-CM

## 2023-08-21 MED ORDER — TOBRAMYCIN-DEXAMETHASONE 0.3-0.1 % OP SUSP: 2.0000 [drp] | Freq: Three times a day (TID) | OPHTHALMIC | 0 refills | Status: AC

## 2023-08-21 NOTE — Discharge Instructions (Addendum)
 Instructed patient to wear glasses and not contact lenses for the next 5 days.  Advised patient to instill eyedrops as directed for the next 5 days.  Advised if symptoms worsen and/or unresolved please follow-up with your optometrist/ophthalmologist for further evaluation.

## 2023-08-21 NOTE — ED Provider Notes (Signed)
 Amanda Sherman    CSN: [id number] Arrival date & time: 08/21/23  1100      History   Chief Complaint Chief Complaint  Patient presents with   Eye Pain    HPI Amanda Sherman is a 23 y.o. female.   HPI pleasant 23 year old female presents with right eye pain for 2 days and believes this may be a corneal abrasion.  Patient wears glasses for vision.  Past Medical History:  Diagnosis Date   Medical history non-contributory    UTI (urinary tract infection)    Wears glasses 07/05/2013    Patient Active Problem List   Diagnosis Date Noted   Supervision of normal pregnancy 07/08/2021    Past Surgical History:  Procedure Laterality Date   NO PAST SURGERIES      OB History     Gravida  2   Para      Term      Preterm      AB  1   Living         SAB      IAB  1   Ectopic      Multiple      Live Births           Obstetric Comments  2021 medication AB          Home Medications    Prior to Admission medications   Medication Sig Start Date End Date Taking? Authorizing Provider  tobramycin -dexamethasone  (TOBRADEX ) ophthalmic solution Place 2 drops into the right eye in the morning, at noon, and at bedtime. 08/21/23  Yes Teddy Sharper, FNP    Family History Family History  Problem Relation Age of Onset   Healthy Mother    Drug abuse Father        overdose   Stroke Father 54       died at age 68    Hypertension Father    Heart disease Other 89       died at age 61 from a heart attack    Social History Social History   Tobacco Use   Smoking status: Never   Smokeless tobacco: Never   Tobacco comments:    Stopped vaping with +prg test 5/23  Vaping Use   Vaping status: Former  Substance Use Topics   Alcohol use: Not Currently    Comment: only on the wkends, usually every other   Drug use: Never     Allergies   Patient has no known allergies.   Review of Systems Review of Systems  HENT:  Negative for ear pain.    Eyes:  Positive for pain.  All other systems reviewed and are negative.    Physical Exam Triage Vital Signs ED Triage Vitals  Encounter Vitals Group     BP      Girls Systolic BP Percentile      Girls Diastolic BP Percentile      Boys Systolic BP Percentile      Boys Diastolic BP Percentile      Pulse      Resp      Temp      Temp src      SpO2      Weight      Height      Head Circumference      Peak Flow      Pain Score      Pain Loc      Pain Education  Exclude from Growth Chart    No data found.  Updated Vital Signs BP 131/85   Pulse 72   Temp 99.2 F (37.3 C)   Resp 19   LMP 08/12/2023   SpO2 98%   Visual Acuity Right Eye Distance:   Left Eye Distance:   Bilateral Distance:    Right Eye Near:   Left Eye Near:    Bilateral Near:     Physical Exam Vitals and nursing note reviewed.  Constitutional:      General: She is not in acute distress.    Appearance: Normal appearance. She is normal weight. She is not ill-appearing.  HENT:     Head: Normocephalic and atraumatic.     Mouth/Throat:     Mouth: Mucous membranes are moist.     Pharynx: Oropharynx is clear.  Eyes:     Extraocular Movements: Extraocular movements intact.     Conjunctiva/sclera: Conjunctivae normal.     Pupils: Pupils are equal, round, and reactive to light.     Comments: Right eye: Inspected with loops no foreign body noted, 3 drops of tetracaine  hydrochloride ophthalmic solution 0.5% instilled into right eye after several minutes [employer]  fluorescein  strip placed in subconjunctival pouch briefly, black light reveals tiny pinpoint corneal abrasion at 7 PM on cornea face; advanced eye relief eyewash used 3 times to irrigate right eye completely, patient tolerated procedure well with no complications  Cardiovascular:     Rate and Rhythm: Normal rate and regular rhythm.     Pulses: Normal pulses.     Heart sounds: Normal heart sounds.  Pulmonary:     Effort: Pulmonary effort is  normal.     Breath sounds: Normal breath sounds. No wheezing, rhonchi or rales.  Musculoskeletal:        General: Normal range of motion.  Skin:    General: Skin is warm and dry.  Neurological:     General: No focal deficit present.     Mental Status: She is alert and oriented to person, place, and time. Mental status is at baseline.  Psychiatric:        Mood and Affect: Mood normal.        Behavior: Behavior normal.      UC Treatments / Results  Labs (all labs ordered are listed, but only abnormal results are displayed) Labs Reviewed - No data to display  EKG   Radiology No results found.  Procedures Procedures (including critical Sherman time)  Medications Ordered in UC Medications - No data to display  Initial Impression / Assessment and Plan / UC Course  I have reviewed the triage vital signs and the nursing notes.  Pertinent labs & imaging results that were available during my Sherman of the patient were reviewed by me and considered in my medical decision making (see chart for details).     MDM: 1.  Abrasion of right cornea, initial encounter-presumably from patient attempting to remove very thin contact lens, Rx'd TobraDex  ophthalmic solution: Place 2 drops into right eye morning, noon, and bedtime for the next 5 days; 2.  Pain of right eye-Rx'd TobraDex  ophthalmic solution: Place 2 drops into right eye morning, noon, and bedtime for the next 5 days. Instructed patient to wear glasses and not contact lenses for the next 5 days.  Advised patient to instill eyedrops as directed for the next 5 days.  Advised if symptoms worsen and/or unresolved please follow-up with your optometrist/ophthalmologist for further evaluation.  Patient discharged home, hemodynamically stable. Final  Clinical Impressions(s) / UC Diagnoses   Final diagnoses:  Pain of right eye  Abrasion of right cornea, initial encounter     Discharge Instructions      Instructed patient to wear glasses and  not contact lenses for the next 5 days.  Advised patient to instill eyedrops as directed for the next 5 days.  Advised if symptoms worsen and/or unresolved please follow-up with your optometrist/ophthalmologist for further evaluation.     ED Prescriptions     Medication Sig Dispense Auth. Provider   tobramycin -dexamethasone  (TOBRADEX ) ophthalmic solution Place 2 drops into the right eye in the morning, at noon, and at bedtime. 5 mL Brookelyn Gaynor, FNP      PDMP not reviewed this encounter.   Teddy Sharper, FNP 08/21/23 1223

## 2023-08-21 NOTE — ED Triage Notes (Signed)
 Pt presents to [facility] with co right eye pain and issues since Saturday. Pt reports she had a corneal abrasion in the past and this feels very similar.
